# Patient Record
Sex: Female | Born: 1985 | Race: White | Hispanic: No | Marital: Married | Smoking: Never smoker
Health system: Southern US, Community
[De-identification: ages and names within clinical notes are randomized; demographics above are authoritative.]

## PROBLEM LIST (undated history)

## (undated) DIAGNOSIS — K8020 Calculus of gallbladder without cholecystitis without obstruction: Secondary | ICD-10-CM

## (undated) HISTORY — PX: CHOLECYSTECTOMY: SHX55

## (undated) HISTORY — PX: TONSILLECTOMY AND ADENOIDECTOMY: SUR1326

## (undated) HISTORY — DX: Calculus of gallbladder without cholecystitis without obstruction: K80.20

---

## 2013-04-05 ENCOUNTER — Emergency Department (HOSPITAL_COMMUNITY): Payer: Managed Care, Other (non HMO)

## 2013-04-05 ENCOUNTER — Emergency Department (HOSPITAL_COMMUNITY)
Admission: EM | Admit: 2013-04-05 | Discharge: 2013-04-05 | Disposition: A | Discharge status: Home / Self Care | Payer: Managed Care, Other (non HMO) | Attending: Emergency Medicine | Admitting: Emergency Medicine

## 2013-04-05 ENCOUNTER — Encounter (HOSPITAL_COMMUNITY): Payer: Self-pay | Admitting: Emergency Medicine

## 2013-04-05 DIAGNOSIS — R0602 Shortness of breath: Secondary | ICD-10-CM | POA: Insufficient documentation

## 2013-04-05 DIAGNOSIS — Z3202 Encounter for pregnancy test, result negative: Secondary | ICD-10-CM | POA: Insufficient documentation

## 2013-04-05 DIAGNOSIS — L259 Unspecified contact dermatitis, unspecified cause: Secondary | ICD-10-CM

## 2013-04-05 DIAGNOSIS — R110 Nausea: Secondary | ICD-10-CM | POA: Insufficient documentation

## 2013-04-05 DIAGNOSIS — K8020 Calculus of gallbladder without cholecystitis without obstruction: Secondary | ICD-10-CM | POA: Insufficient documentation

## 2013-04-05 DIAGNOSIS — Z79899 Other long term (current) drug therapy: Secondary | ICD-10-CM | POA: Insufficient documentation

## 2013-04-05 LAB — URINALYSIS, ROUTINE W REFLEX MICROSCOPIC
Bilirubin Urine: NEGATIVE
GLUCOSE, UA: NEGATIVE mg/dL
Hgb urine dipstick: NEGATIVE
KETONES UR: NEGATIVE mg/dL
LEUKOCYTES UA: NEGATIVE
NITRITE: NEGATIVE
PH: 6.5 (ref 5.0–8.0)
Protein, ur: NEGATIVE mg/dL
SPECIFIC GRAVITY, URINE: 1.006 (ref 1.005–1.030)
Urobilinogen, UA: 0.2 mg/dL (ref 0.0–1.0)

## 2013-04-05 LAB — COMPREHENSIVE METABOLIC PANEL
ALBUMIN: 3.4 g/dL — AB (ref 3.5–5.2)
ALT: 23 U/L (ref 0–35)
AST: 17 U/L (ref 0–37)
Alkaline Phosphatase: 63 U/L (ref 39–117)
BUN: 12 mg/dL (ref 6–23)
CO2: 24 mEq/L (ref 19–32)
Calcium: 8.8 mg/dL (ref 8.4–10.5)
Chloride: 104 mEq/L (ref 96–112)
Creatinine, Ser: 0.7 mg/dL (ref 0.50–1.10)
GFR calc Af Amer: >90 mL/min (ref >90)
GFR calc non Af Amer: >90 mL/min (ref >90)
Glucose, Bld: 90 mg/dL (ref 70–99)
POTASSIUM: 4.1 meq/L (ref 3.7–5.3)
Sodium: 139 mEq/L (ref 137–147)
TOTAL PROTEIN: 6.4 g/dL (ref 6.0–8.3)
Total Bilirubin: <0.2 mg/dL — ABNORMAL LOW (ref 0.3–1.2)

## 2013-04-05 LAB — CBC WITH DIFFERENTIAL/PLATELET
BASOS ABS: 0 10*3/uL (ref 0.0–0.1)
BASOS PCT: 0 % (ref 0–1)
EOS ABS: 0.1 10*3/uL (ref 0.0–0.7)
Eosinophils Relative: 1 % (ref 0–5)
HCT: 35.5 % — ABNORMAL LOW (ref 36.0–46.0)
Hemoglobin: 11.6 g/dL — ABNORMAL LOW (ref 12.0–15.0)
Lymphocytes Relative: 29 % (ref 12–46)
Lymphs Abs: 2.1 10*3/uL (ref 0.7–4.0)
MCH: 30.2 pg (ref 26.0–34.0)
MCHC: 32.7 g/dL (ref 30.0–36.0)
MCV: 92.4 fL (ref 78.0–100.0)
Monocytes Absolute: 0.3 10*3/uL (ref 0.1–1.0)
Monocytes Relative: 5 % (ref 3–12)
NEUTROS ABS: 4.8 10*3/uL (ref 1.7–7.7)
NEUTROS PCT: 65 % (ref 43–77)
PLATELETS: 363 10*3/uL (ref 150–400)
RBC: 3.84 MIL/uL — AB (ref 3.87–5.11)
RDW: 13.1 % (ref 11.5–15.5)
WBC: 7.3 10*3/uL (ref 4.0–10.5)

## 2013-04-05 LAB — POCT PREGNANCY, URINE: Preg Test, Ur: NEGATIVE

## 2013-04-05 LAB — LIPASE, BLOOD: Lipase: 37 U/L (ref 11–59)

## 2013-04-05 MED ORDER — METHYLPREDNISOLONE SODIUM SUCC 125 MG IJ SOLR
125.0000 mg | Freq: Once | INTRAMUSCULAR | Status: AC
  Administered 2013-04-05: 125 mg via INTRAVENOUS
  Filled 2013-04-05: qty 2

## 2013-04-05 MED ORDER — PREDNISONE 20 MG PO TABS: 60.0000 mg | ORAL_TABLET | Freq: Every day | ORAL | Status: DC

## 2013-04-05 MED ORDER — OXYCODONE-ACETAMINOPHEN 5-325 MG PO TABS: 1.0000 | ORAL_TABLET | ORAL | Status: DC | PRN

## 2013-04-05 MED ORDER — ONDANSETRON HCL 4 MG/2ML IJ SOLN
4.0000 mg | Freq: Once | INTRAMUSCULAR | Status: AC
  Administered 2013-04-05: 4 mg via INTRAVENOUS
  Filled 2013-04-05: qty 2

## 2013-04-05 MED ORDER — GI COCKTAIL ~~LOC~~
30.0000 mL | Freq: Once | ORAL | Status: AC
  Administered 2013-04-05: 30 mL via ORAL
  Filled 2013-04-05: qty 30

## 2013-04-05 MED ORDER — SODIUM CHLORIDE 0.9 % IV BOLUS (SEPSIS)
1000.0000 mL | Freq: Once | INTRAVENOUS | Status: AC
  Administered 2013-04-05: 1000 mL via INTRAVENOUS

## 2013-04-05 MED ORDER — PANTOPRAZOLE SODIUM 40 MG IV SOLR
40.0000 mg | Freq: Once | INTRAVENOUS | Status: AC
  Administered 2013-04-05: 40 mg via INTRAVENOUS
  Filled 2013-04-05: qty 40

## 2013-04-05 MED ORDER — DIPHENHYDRAMINE HCL 50 MG/ML IJ SOLN
50.0000 mg | Freq: Once | INTRAMUSCULAR | Status: AC
  Administered 2013-04-05: 50 mg via INTRAVENOUS
  Filled 2013-04-05: qty 1

## 2013-04-05 MED ORDER — MORPHINE SULFATE 4 MG/ML IJ SOLN
4.0000 mg | Freq: Once | INTRAMUSCULAR | Status: AC
  Administered 2013-04-05: 4 mg via INTRAVENOUS
  Filled 2013-04-05: qty 1

## 2013-04-05 MED ORDER — ONDANSETRON HCL 4 MG PO TABS: 4.0000 mg | ORAL_TABLET | Freq: Four times a day (QID) | ORAL | Status: DC

## 2013-04-05 NOTE — Discharge Instructions (Signed)
Cholelithiasis Cholelithiasis (also called gallstones) is a form of gallbladder disease in which gallstones form in your gallbladder. The gallbladder is an organ that stores bile made in the liver, which helps digest fats. Gallstones begin as small crystals and slowly grow into stones. Gallstone pain occurs when the gallbladder spasms and a gallstone is blocking the duct. Pain can also occur when a stone passes out of the duct.  RISK FACTORS  Being female.   Having multiple pregnancies. Health care providers sometimes advise removing diseased gallbladders before future pregnancies.   Being obese.  Eating a diet heavy in fried foods and fat.   Being older than 60 years and increasing age.   Prolonged use of medicines containing female hormones.   Having diabetes mellitus.   Rapidly losing weight.   Having a family history of gallstones (heredity).  SYMPTOMS  Nausea.   Vomiting.  Abdominal pain.   Yellowing of the skin (jaundice).   Sudden pain. It may persist from several minutes to several hours.  Fever.   Tenderness to the touch. In some cases, when gallstones do not move into the bile duct, people have no pain or symptoms. These are called "silent" gallstones.  TREATMENT Silent gallstones do not need treatment. In severe cases, emergency surgery may be required. Options for treatment include:  Surgery to remove the gallbladder. This is the most common treatment.  Medicines. These do not always work and may take 6 12 months or more to work.  Shock wave treatment (extracorporeal biliary lithotripsy). In this treatment an ultrasound machine sends shock waves to the gallbladder to break gallstones into smaller pieces that can pass into the intestines or be dissolved by medicine. HOME CARE INSTRUCTIONS   Only take over-the-counter or prescription medicines for pain, discomfort, or fever as directed by your health care provider.   Follow a low-fat diet until  seen again by your health care provider. Fat causes the gallbladder to contract, which can result in pain.   Follow up with your health care provider as directed. Attacks are almost always recurrent and surgery is usually required for permanent treatment.  SEEK IMMEDIATE MEDICAL CARE IF:   Your pain increases and is not controlled by medicines.   You have a fever or persistent symptoms for more than 2 3 days.   You have a fever and your symptoms suddenly get worse.   You have persistent nausea and vomiting.  MAKE SURE YOU:   Understand these instructions.  Will watch your condition.  Will get help right away if you are not doing well or get worse. Document Released: 03/16/2005 Document Revised: 11/20/2012 Document Reviewed: 09/11/2012 [facility] Patient Information 2014 [place], [place].  Contact Dermatitis Contact dermatitis is a reaction to certain substances that touch the skin. Contact dermatitis can be either irritant contact dermatitis or allergic contact dermatitis. Irritant contact dermatitis does not require previous exposure to the substance for a reaction to occur.Allergic contact dermatitis only occurs if you have been exposed to the substance before. Upon a repeat exposure, your body reacts to the substance.  CAUSES  Many substances can cause contact dermatitis. Irritant dermatitis is most commonly caused by repeated exposure to mildly irritating substances, such as:  Makeup.  Soaps.  Detergents.  Bleaches.  Acids.  Metal salts, such as nickel. Allergic contact dermatitis is most commonly caused by exposure to:  Poisonous plants.  Chemicals (deodorants, shampoos).  Jewelry.  Latex.  Neomycin in triple antibiotic cream.  Preservatives in products, including clothing. SYMPTOMS  The area of skin that is exposed may develop:  Dryness or flaking.  Redness.  Cracks.  Itching.  Pain or a burning sensation.  Blisters. With allergic contact  dermatitis, there may also be swelling in areas such as the eyelids, mouth, or genitals.  DIAGNOSIS  Your caregiver can usually tell what the problem is by doing a physical exam. In cases where the cause is uncertain and an allergic contact dermatitis is suspected, a patch skin test may be performed to help determine the cause of your dermatitis. TREATMENT Treatment includes protecting the skin from further contact with the irritating substance by avoiding that substance if possible. Barrier creams, powders, and gloves may be helpful. Your caregiver may also recommend:  Steroid creams or ointments applied 2 times daily. For best results, soak the rash area in cool water for 20 minutes. Then apply the medicine. Cover the area with a plastic wrap. You can store the steroid cream in the refrigerator for a "chilly" effect on your rash. That may decrease itching. Oral steroid medicines may be needed in more severe cases.  Antibiotics or antibacterial ointments if a skin infection is present.  Antihistamine lotion or an antihistamine taken by mouth to ease itching.  Lubricants to keep moisture in your skin.  Burow's solution to reduce redness and soreness or to dry a weeping rash. Mix one packet or tablet of solution in 2 cups cool water. Dip a clean washcloth in the mixture, wring it out a bit, and put it on the affected area. Leave the cloth in place for 30 minutes. Do this as often as possible throughout the day.  Taking several cornstarch or baking soda baths daily if the area is too large to cover with a washcloth. Harsh chemicals, such as alkalis or acids, can cause skin damage that is like a burn. You should flush your skin for 15 to 20 minutes with cold water after such an exposure. You should also seek immediate medical care after exposure. Bandages (dressings), antibiotics, and pain medicine may be needed for severely irritated skin.  HOME CARE INSTRUCTIONS  Avoid the substance that caused  your reaction.  Keep the area of skin that is affected away from hot water, soap, sunlight, chemicals, acidic substances, or anything else that would irritate your skin.  Do not scratch the rash. Scratching may cause the rash to become infected.  You may take cool baths to help stop the itching.  Only take over-the-counter or prescription medicines as directed by your caregiver.  See your caregiver for follow-up care as directed to make sure your skin is healing properly. SEEK MEDICAL CARE IF:   Your condition is not better after 3 days of treatment.  You seem to be getting worse.  You see signs of infection such as swelling, tenderness, redness, soreness, or warmth in the affected area.  You have any problems related to your medicines. Document Released: 03/17/2000 Document Revised: 06/12/2011 Document Reviewed: 08/23/2010 [facility] Patient Information 2014 [place], [place].   You may continue to take Benadryl 50 mg every 8 hours as needed for itching. This medication is found over-the-counter.

## 2013-04-05 NOTE — ED Notes (Signed)
Pt having ultrasound in room. Ultrasound tech states that she needs pts bladder full for ultrasound

## 2013-04-05 NOTE — ED Notes (Addendum)
Patient has red rash that started on both knees but then spread throughout her body. Patient mostly concerned with the pain in her chest when she swallows that radiates to her back. 2/10pain at this moment

## 2013-04-05 NOTE — ED Provider Notes (Signed)
TIME SEEN: 6:22 PM  CHIEF COMPLAINT: Chest pain, abdominal pain  HPI: Patient is a 28 year old female with no significant past medical history who presents emergency department with 3 years of intermittent chest pain and epigastric, right upper quadrant pain. She reports that she has had an ultrasound that showed cholelithiasis in the past. She states that over the past 4 days her pain has been constant. She describes it as a sharp pain that radiates into her back. She states she does have some mild shortness of breath with it and nausea. No vomiting or diarrhea. No bloody stools or melena. She states she drinks occasionally and takes 800 mg of ibuprofen once daily for this pain. She denies any fevers but states she has had chills. She denies a prior history of PE or DVT, lower extreme swelling or pain, tobacco use, exogenous estrogen use, recent prolonged immobilization such as flight or hospitalization, surgery, trauma. She states she has Mirena IUD.  Patient is worse after eating spicy and fatty foods. Patient reports her pain improves with ibuprofen.  ROS: See HPI Constitutional: no fever  Eyes: no drainage  ENT: no runny nose   Cardiovascular:   chest pain  Resp: SOB  GI: no vomiting GU: no dysuria Integumentary: no rash  Allergy: no hives  Musculoskeletal: no leg swelling  Neurological: no slurred speech ROS otherwise negative  PAST MEDICAL HISTORY/PAST SURGICAL HISTORY:  History reviewed. No pertinent past medical history.  MEDICATIONS:  Prior to Admission medications   Medication Sig Start Date End Date Taking? Authorizing Provider  Melatonin 3 MG TABS Take 6 mg by mouth at bedtime.   Yes Historical Provider, MD  sertraline (ZOLOFT) 50 MG tablet Take 50 mg by mouth daily.  03/25/13  Yes Historical Provider, MD    ALLERGIES:  No Known Allergies  SOCIAL HISTORY:  History  Substance Use Topics  . Smoking status: Never Smoker   . Smokeless tobacco: Not on file  . Alcohol  Use: Not on file    FAMILY HISTORY: History reviewed. No pertinent family history.  No history of premature CAD.  EXAM: BP 127/76  Pulse 75  Temp(Src) 98.3 F (36.8 C) (Oral)  Resp 20  SpO2 99% CONSTITUTIONAL: Alert and oriented and responds appropriately to questions. Well-appearing; well-nourished HEAD: Normocephalic EYES: Conjunctivae clear, PERRL ENT: normal nose; no rhinorrhea; moist mucous membranes; pharynx without lesions noted NECK: Supple, no meningismus, no LAD  CARD: RRR; S1 and S2 appreciated; no murmurs, no clicks, no rubs, no gallops RESP: Normal chest excursion without splinting or tachypnea; breath sounds clear and equal bilaterally; no wheezes, no rhonchi, no rales,  ABD/GI: Normal bowel sounds; non-distended; soft, tender to palpation in the right upper quadrant without guarding or rebound, no peritoneal signs, negative Murphy sign BACK:  The back appears normal and is non-tender to palpation, there is no CVA tenderness EXT: Normal ROM in all joints; non-tender to palpation; no edema; normal capillary refill; no cyanosis    SKIN: Normal color for age and race; warm; multiple erythematous patches to patient's extremities and neck she reports is pruritic, flat NEURO: Moves all extremities equally PSYCH: The patient's mood and manner are appropriate. Grooming and personal hygiene are appropriate.  MEDICAL DECISION MAKING: Patient here with chest pain and epigastric pain for the past 4 days has been constant. Likely secondary to her biliary colic  Will obtain labs, urine, repeat ultrasound to rule out choledocholithiasis and cholecystitis given constant pain. We'll give antiemetics and pain medication. Patient has no  risk factors for pulmonary embolus or ACS.  ED PROGRESS: Patient's ultrasound shows cholelithiasis without any signs of cholecystitis. Her labs are unremarkable. She reports some improvement after morphine. Will give GI cocktail, PPI and repeat morphine and  reassess.   9:47 PM Pt reports feeling better. She has been able to tolerate by mouth in the ED. we'll discharge home with outpatient surgery followup, pain and nausea medication. Given return precautions. Patient and husband at bedside verbalize understanding and are comfortable with plan. We'll also discharge home with prednisone for her contact dermatitis. Her rash is improved while in the ED. We'll have her continue taking Benadryl when necessary.  Layla Maw Cindy Brindisi, DO 04/05/13 [id number]

## 2013-04-05 NOTE — ED Notes (Signed)
Pt was able to drink some water prior to being given her GI cocktail

## 2013-04-05 NOTE — ED Notes (Signed)
She c/o epigastric pain radiating into upper back/x 4 days.  She states she was found to have cholelithiasis 3 years ago--seen on OB ultrasound.  She is in no distress.  She also mentions she has an itchy rash at upper extremities and upper trunk area since yesterday.  She is in no distress.  She c/o anorexia today.

## 2013-04-05 NOTE — ED Notes (Signed)
Pt made aware of need for urine specimen 

## 2013-04-07 ENCOUNTER — Encounter: Payer: Self-pay | Admitting: Nurse Practitioner

## 2013-04-07 ENCOUNTER — Ambulatory Visit (INDEPENDENT_AMBULATORY_CARE_PROVIDER_SITE_OTHER): Payer: Managed Care, Other (non HMO) | Admitting: Nurse Practitioner

## 2013-04-07 VITALS — BP 102/80 | HR 64 | Temp 98.0°F | Ht 67.0 in | Wt 275.2 lb

## 2013-04-07 DIAGNOSIS — K8020 Calculus of gallbladder without cholecystitis without obstruction: Secondary | ICD-10-CM

## 2013-04-07 DIAGNOSIS — L282 Other prurigo: Secondary | ICD-10-CM

## 2013-04-07 NOTE — Patient Instructions (Signed)
Please consult with surgeon regarding removal of gallbladder. In the meantime, avoid animal fats, only consume plant fats like avocado, nuts, seeds, olives, stay hydrated by sipping colorless soda or water every hour while awake.  Schedule appt for CPE in future at your convenience. Pleasure to meet you!   Cholelithiasis Cholelithiasis (also called gallstones) is a form of gallbladder disease in which gallstones form in your gallbladder. The gallbladder is an organ that stores bile made in the liver, which helps digest fats. Gallstones begin as small crystals and slowly grow into stones. Gallstone pain occurs when the gallbladder spasms and a gallstone is blocking the duct. Pain can also occur when a stone passes out of the duct.  RISK FACTORS  Being female.   Having multiple pregnancies. Health care providers sometimes advise removing diseased gallbladders before future pregnancies.   Being obese.  Eating a diet heavy in fried foods and fat.   Being older than 60 years and increasing age.   Prolonged use of medicines containing female hormones.   Having diabetes mellitus.   Rapidly losing weight.   Having a family history of gallstones (heredity).  SYMPTOMS  Nausea.   Vomiting.  Abdominal pain.   Yellowing of the skin (jaundice).   Sudden pain. It may persist from several minutes to several hours.  Fever.   Tenderness to the touch. In some cases, when gallstones do not move into the bile duct, people have no pain or symptoms. These are called "silent" gallstones.  TREATMENT Silent gallstones do not need treatment. In severe cases, emergency surgery may be required. Options for treatment include:  Surgery to remove the gallbladder. This is the most common treatment.  Medicines. These do not always work and may take 6 12 months or more to work.  Shock wave treatment (extracorporeal biliary lithotripsy). In this treatment an ultrasound machine sends shock  waves to the gallbladder to break gallstones into smaller pieces that can pass into the intestines or be dissolved by medicine. HOME CARE INSTRUCTIONS   Only take over-the-counter or prescription medicines for pain, discomfort, or fever as directed by your health care provider.   Follow a low-fat diet until seen again by your health care provider. Fat causes the gallbladder to contract, which can result in pain.   Follow up with your health care provider as directed. Attacks are almost always recurrent and surgery is usually required for permanent treatment.  SEEK IMMEDIATE MEDICAL CARE IF:   Your pain increases and is not controlled by medicines.   You have a fever or persistent symptoms for more than 2 3 days.   You have a fever and your symptoms suddenly get worse.   You have persistent nausea and vomiting.  MAKE SURE YOU:   Understand these instructions.  Will watch your condition.  Will get help right away if you are not doing well or get worse. Document Released: 03/16/2005 Document Revised: 11/20/2012 Document Reviewed: 09/11/2012 [facility] Patient Information 2014 [place], [place].

## 2013-04-07 NOTE — Progress Notes (Signed)
Subjective:     Anita Gray is a 28 y.o. female who presents for follow up after an ED visit for abdominal pain. She wishes to establish care. She gives history of "gallbladder attacks" for 3 years with worse recent attack lasting 4 days and accompanied by nausea, anorexia, epigastric pain heart burn, and total body rash. Abd US in ED confirmed GB stones without inflammation of GB, o/w nml. All labs nml-liver, amylase, lytes, CBC. She was given solu-medrol in ED & script for prednisone which has made rash some better, but it is persistent. She is taking benadryl every 4 hours.    The patient's history has been marked as reviewed and updated as appropriate.  Review of Systems Constitutional: negative for weight loss Respiratory: negative for cough Cardiovascular: negative for chest pain and irregular heart beat Gastrointestinal: positive for abdominal pain, nausea and reflux symptoms, negative for change in bowel habits, diarrhea, jaundice, melena and vomiting Genitourinary:negative Integument/breast: positive for pruritus and rash Musculoskeletal:negative for arthralgias and stiff joints Allergic/Immunologic: negative     Objective:    BP 102/80  Pulse 64  Temp(Src) 98 F (36.7 C) (Oral)  Ht 5\' 7"  (1.702 m)  Wt 275 lb 4 oz (124.853 kg)  BMI 43.10 kg/m2  SpO2 97% General appearance: alert, cooperative, appears stated age and no distress Head: Normocephalic, without obvious abnormality, atraumatic Eyes: negative findings: lids and lashes normal and conjunctivae and sclerae normal Throat: lips, mucosa, and tongue normal; teeth and gums normal Neck: no adenopathy, no carotid bruit, supple, symmetrical, trachea midline and thyroid not enlarged, symmetric, no tenderness/mass/nodules Lungs: clear to auscultation bilaterally Heart: regular rate and rhythm, S1, S2 normal, no murmur, click, rub or gallop Abdomen: soft, non-tender; bowel sounds normal; no masses,  no  organomegaly Extremities: edema mild pitting at ankles  Pulses: 2+ and symmetric Skin: splotchy redn patches face, lacy macular red rash back & abdomen, arms, Also upper arms solid patch erythema as if sun burned.    Assessment:    Abdominal pain, likely secondary to gall stones Rash possibly r/t cholestasis, improved with systemic steroids, but persistent & pruritic .    Plan:    Referral to Gen surgery for consult Cont oral steroids. Stop benadryl. Start claritin or zyrtec.

## 2013-04-07 NOTE — Progress Notes (Signed)
Pre-visit discussion using our clinic review tool. No additional management support is needed unless otherwise documented below in the visit note.  

## 2013-04-14 ENCOUNTER — Telehealth (INDEPENDENT_AMBULATORY_CARE_PROVIDER_SITE_OTHER): Payer: Self-pay | Admitting: General Surgery

## 2013-04-14 ENCOUNTER — Ambulatory Visit (INDEPENDENT_AMBULATORY_CARE_PROVIDER_SITE_OTHER): Payer: Managed Care, Other (non HMO) | Admitting: General Surgery

## 2013-04-14 ENCOUNTER — Encounter (INDEPENDENT_AMBULATORY_CARE_PROVIDER_SITE_OTHER): Payer: Self-pay | Admitting: General Surgery

## 2013-04-14 VITALS — BP 122/80 | HR 83 | Temp 98.5°F | Resp 16 | Ht 69.0 in | Wt 273.8 lb

## 2013-04-14 DIAGNOSIS — K8020 Calculus of gallbladder without cholecystitis without obstruction: Secondary | ICD-10-CM

## 2013-04-14 NOTE — Progress Notes (Signed)
Patient ID: Anita Gray, female   DOB: 02-May-1985, 28 y.o.   MRN: [id number]  Chief Complaint  Patient presents with  . Cholelithiasis    new patient     HPI Anita ShellGeorgianna Gray is a 28 y.o. female.   HPI 28 yo WF referred by Maximino SarinLayne Weaver, NP, for evaluation of gallstones. The patient states that she learned that she had gallstones during her 2011 pregnancy. She had several attacks of discomfort in her epigastrium and right upper quadrant which prompted an ultrasound demonstrating gallstones. She states that generally she would have 2-3 episodes a month. However 2 weeks ago she developed recurring epigastric and right upper quadrant pain everyday for 4 days in a row. She took ibuprofen which did help a little bit. The other Friday night and Saturday she developed a severe attack that was the worst pain she had had today prompting her to go to the emergency room. She also did develop a rash that started on her knees and spread over her entire body. She denies any new medications or soaps or detergents. She was given steroids for the rash and it has subsequently resolved. She really hasn't had any severe attacks since that episode the other Saturday. She denies any acholic stools. She denies any jaundice. She denies any weight loss. She denies any melena or hematochezia. She works as an account Past Medical History  Diagnosis Date  . Cholelithiasis     Past Surgical History  Procedure Laterality Date  . Cesarean section    . Tonsillectomy and adenoidectomy      Family History  Problem Relation Age of Onset  . Hypertension Mother   . Depression Mother   . Cholecystitis Mother   . Diabetes Father   . Cholecystitis Sister   . Cancer Maternal Grandmother     breast  . Cancer Maternal Grandfather     metastatic    Social History History  Substance Use Topics  . Smoking status: Never Smoker   . Smokeless tobacco: Not on file  . Alcohol Use: No    No Known  Allergies  Current Outpatient Prescriptions  Medication Sig Dispense Refill  . Melatonin 3 MG TABS Take 6 mg by mouth at bedtime.      . sertraline (ZOLOFT) 50 MG tablet Take 50 mg by mouth daily.        No current facility-administered medications for this visit.    Review of Systems Review of Systems  Constitutional: Negative for fever, activity change, appetite change and unexpected weight change.  HENT: Negative for nosebleeds and trouble swallowing.   Eyes: Negative for photophobia and visual disturbance.  Respiratory: Negative for chest tightness and shortness of breath.   Cardiovascular: Negative for chest pain and leg swelling.       Denies CP, SOB, orthopnea, PND, DOE  Gastrointestinal:       See HPI  Genitourinary: Negative for dysuria and difficulty urinating.  Musculoskeletal: Negative for arthralgias.  Skin: Positive for rash (recent rash during most recent attack - but now resolved. ). Negative for pallor.  Neurological: Negative for dizziness, seizures, facial asymmetry and numbness.       Denies TIA and amaurosis fugax   Hematological: Negative for adenopathy. Does not bruise/bleed easily.  Psychiatric/Behavioral: Negative for behavioral problems and agitation.    Blood pressure 122/80, pulse 83, temperature 98.5 F (36.9 C), temperature source Temporal, resp. rate 16, height 5\' 9"  (1.753 m), weight 273 lb 12.8 oz (124.195 kg).  Physical Exam Physical Exam  Vitals reviewed. Constitutional: She is oriented to person, place, and time. She appears well-developed and well-nourished. No distress.  obese  HENT:  Head: Normocephalic and atraumatic.  Right Ear: External ear normal.  Left Ear: External ear normal.  Eyes: Conjunctivae are normal. No scleral icterus.  Neck: Normal range of motion. Neck supple. No tracheal deviation present. No thyromegaly present.  Cardiovascular: Normal rate and normal heart sounds.   Pulmonary/Chest: Effort normal and breath sounds  normal. No stridor. No respiratory distress. She has no wheezes.  Abdominal: Soft. She exhibits no distension. There is no tenderness. There is no rebound and no guarding.  Musculoskeletal: She exhibits no edema and no tenderness.  Lymphadenopathy:    She has no cervical adenopathy.  Neurological: She is alert and oriented to person, place, and time. She exhibits normal muscle tone.  Skin: Skin is warm and dry. No rash noted. She is not diaphoretic. No erythema.  Psychiatric: She has a normal mood and affect. Her behavior is normal. Judgment and thought content normal.    Data Reviewed Alben Spittle, NP note 04/07/13 ED note 04/05/13 abd u/s - multiple GS, duct ok Labs from 04/05/13 ok  Assessment    Symptomatic cholelithiasis     Plan    I believe the patient's symptoms are consistent with gallbladder disease.  We discussed gallbladder disease. The patient was given Agricultural engineer. We discussed non-operative and operative management. We discussed the signs & symptoms of acute cholecystitis  I discussed laparoscopic cholecystectomy with IOC in detail.  The patient was given educational material as well as diagrams detailing the procedure.  We discussed the risks and benefits of a laparoscopic cholecystectomy including, but not limited to bleeding, infection, injury to surrounding structures such as the intestine or liver, bile leak, retained gallstones, need to convert to an open procedure, prolonged diarrhea, blood clots such as  DVT, common bile duct injury, anesthesia risks, and possible need for additional procedures.  We discussed the typical post-operative recovery course. I explained that the likelihood of improvement of their symptoms is good.  She was given access to the [facility] cholecystectomy video  She has elected to proceed with surgery. She will be scheduled in the near future. Not sure of the etiology of her rash. Most likely it was a drug related rash. I have not heard of a  macular papular rash in the setting of gallbladder disease  Mary Sella. Andrey Campanile, MD, FACS General, Bariatric, & Minimally Invasive Surgery [facility], [place]         [facility] M 04/14/2013, 3:21 PM

## 2013-04-14 NOTE — Telephone Encounter (Signed)
Pt made aware of [facility] financial obligation will call back to schedule surgery

## 2013-04-14 NOTE — Patient Instructions (Signed)
Watch EMMI video  Please call with any questions

## 2013-04-22 ENCOUNTER — Other Ambulatory Visit (INDEPENDENT_AMBULATORY_CARE_PROVIDER_SITE_OTHER): Payer: Self-pay | Admitting: General Surgery

## 2013-04-22 DIAGNOSIS — K8010 Calculus of gallbladder with chronic cholecystitis without obstruction: Secondary | ICD-10-CM

## 2013-04-23 ENCOUNTER — Other Ambulatory Visit (INDEPENDENT_AMBULATORY_CARE_PROVIDER_SITE_OTHER): Payer: Self-pay | Admitting: *Deleted

## 2013-04-23 MED ORDER — OXYCODONE-ACETAMINOPHEN 5-325 MG PO TABS: 1.0000 | ORAL_TABLET | ORAL | Status: DC | PRN

## 2013-05-14 ENCOUNTER — Ambulatory Visit (INDEPENDENT_AMBULATORY_CARE_PROVIDER_SITE_OTHER): Payer: Managed Care, Other (non HMO) | Admitting: General Surgery

## 2013-05-14 ENCOUNTER — Encounter (INDEPENDENT_AMBULATORY_CARE_PROVIDER_SITE_OTHER): Payer: Self-pay | Admitting: General Surgery

## 2013-05-14 VITALS — BP 128/76 | HR 62 | Temp 98.0°F | Resp 18 | Ht 68.0 in | Wt 269.0 lb

## 2013-05-14 DIAGNOSIS — Z09 Encounter for follow-up examination after completed treatment for conditions other than malignant neoplasm: Secondary | ICD-10-CM

## 2013-05-14 NOTE — Patient Instructions (Signed)
Can resume full activities 

## 2013-05-14 NOTE — Progress Notes (Signed)
Subjective:     Patient ID: Anita Gray, female   DOB: Sep 14, 1985, 28 y.o.   MRN: [id number]  HPI 28 year old Caucasian female status post laparoscopic cholecystectomy with interoperative claims gram on January 20 for symptomatic right upper quadrant pain comes in today for her followup visit. She states that she is doing well. She has none of the symptoms that she had preoperatively. She denies any fever, chills, nausea, vomiting, diarrhea or constipation. She denies any trouble with her incisions.  Review of Systems     Objective:   Physical Exam BP 128/76  Pulse 62  Temp(Src) 98 F (36.7 C)  Resp 18  Ht 5\' 8"  (1.727 m)  Wt 269 lb (122.018 kg)  BMI 40.91 kg/m2  Gen: alert, NAD, non-toxic appearing Pupils: equal, no scleral icterus Pulm: Lungs clear to auscultation, symmetric chest rise CV: regular rate and rhythm Abd: soft, nontender, nondistended. Well-healed trocar sites. No cellulitis. No incisional hernia Skin: no rash, no jaundice     Assessment:     Status post laparoscopic cholecystectomy with interoperative cholangiogram     Plan:     She is doing well. There appears to be no signs of complications from surgery. We reviewed her pathology report which showed gallstones and chronic cholecystitis. All of her questions were answered. Followup as needed   Mary Sellaric M. Andrey CampanileWilson, MD, FACS General, Bariatric, & Minimally Invasive Surgery [facility], [place]

## 2013-09-17 ENCOUNTER — Ambulatory Visit (INDEPENDENT_AMBULATORY_CARE_PROVIDER_SITE_OTHER): Payer: Managed Care, Other (non HMO) | Admitting: Nurse Practitioner

## 2013-09-17 ENCOUNTER — Encounter: Payer: Self-pay | Admitting: Nurse Practitioner

## 2013-09-17 VITALS — BP 110/74 | HR 95 | Temp 98.0°F | Ht 67.0 in | Wt 275.0 lb

## 2013-09-17 DIAGNOSIS — H6990 Unspecified Eustachian tube disorder, unspecified ear: Secondary | ICD-10-CM

## 2013-09-17 DIAGNOSIS — H6980 Other specified disorders of Eustachian tube, unspecified ear: Secondary | ICD-10-CM

## 2013-09-17 DIAGNOSIS — E0789 Other specified disorders of thyroid: Secondary | ICD-10-CM

## 2013-09-17 DIAGNOSIS — J029 Acute pharyngitis, unspecified: Secondary | ICD-10-CM

## 2013-09-17 LAB — TSH: TSH: 1.63 u[IU]/mL (ref 0.35–4.50)

## 2013-09-17 LAB — T4, FREE: Free T4: 0.72 ng/dL (ref 0.60–1.60)

## 2013-09-17 MED ORDER — FLUTICASONE PROPIONATE 50 MCG/ACT NA SUSP: 1.0000 | Freq: Two times a day (BID) | NASAL | Status: DC

## 2013-09-17 NOTE — Progress Notes (Signed)
Pre visit review using our clinic review tool, if applicable. No additional management support is needed unless otherwise documented below in the visit note. 

## 2013-09-17 NOTE — Patient Instructions (Addendum)
You may use benzocaine throat lozenges or throat spray for comfort.This is likely a viral sore throat/upper respiratory infection. However, if your culture comes back growing bacteria, I will call in an antibiotic. In the meantime, start Neilmed sinus rinses daily. Salt water & listerene gargles several times daily. You may use benzocaine throat lozenges or throat spray for comfort. Rest, sip fluids every hour.  Get thyroid ultrasound. My office will call with results. Use flonase daily for at least 1 month. It may help with stuffy ears. Feel better!  Sore Throat A sore throat is pain, burning, irritation, or scratchiness of the throat. There is often pain or tenderness when swallowing or talking. A sore throat may be accompanied by other symptoms, such as coughing, sneezing, fever, and swollen neck glands. A sore throat is often the first sign of another sickness, such as a cold, flu, strep throat, or mononucleosis (commonly known as mono). Most sore throats go away without medical treatment. CAUSES  The most common causes of a sore throat include:  A viral infection, such as a cold, flu, or mono.  A bacterial infection, such as strep throat, tonsillitis, or whooping cough.  Seasonal allergies.  Dryness in the air.  Irritants, such as smoke or pollution.  Gastroesophageal reflux disease (GERD). HOME CARE INSTRUCTIONS   Only take over-the-counter medicines as directed by your caregiver.  Drink enough fluids to keep your urine clear or pale yellow.  Rest as needed.  Try using throat sprays, lozenges, or sucking on hard candy to ease any pain (if older than 4 years or as directed).  Sip warm liquids, such as broth, herbal tea, or warm water with honey to relieve pain temporarily. You may also eat or drink cold or frozen liquids such as frozen ice pops.  Gargle with salt water (mix 1 tsp salt with 8 oz of water).  Do not smoke and avoid secondhand smoke.  Put a cool-mist humidifier  in your bedroom at night to moisten the air. You can also turn on a hot shower and sit in the bathroom with the door closed for 5 10 minutes. SEEK IMMEDIATE MEDICAL CARE IF:  You have difficulty breathing.  You are unable to swallow fluids, soft foods, or your saliva.  You have increased swelling in the throat.  Your sore throat does not get better in 7 days.  You have nausea and vomiting.  You have a fever or persistent symptoms for more than 2 3 days.  You have a fever and your symptoms suddenly get worse. MAKE SURE YOU:   Understand these instructions.  Will watch your condition.  Will get help right away if you are not doing well or get worse. Document Released: 04/27/2004 Document Revised: 03/06/2012 Document Reviewed: 11/26/2011 [facility] Patient Information 2014 [place], [place].

## 2013-09-18 ENCOUNTER — Telehealth: Payer: Self-pay | Admitting: Nurse Practitioner

## 2013-09-18 ENCOUNTER — Ambulatory Visit
Admission: RE | Admit: 2013-09-18 | Discharge: 2013-09-18 | Disposition: A | Discharge status: Home / Self Care | Payer: Managed Care, Other (non HMO) | Source: Ambulatory Visit | Attending: Nurse Practitioner | Admitting: Nurse Practitioner

## 2013-09-18 DIAGNOSIS — E0789 Other specified disorders of thyroid: Secondary | ICD-10-CM

## 2013-09-18 LAB — THYROID PEROXIDASE ANTIBODY: Thyroperoxidase Ab SerPl-aCnc: <10 IU/mL (ref <35.0)

## 2013-09-18 NOTE — Telephone Encounter (Signed)
pls call pt: Advise nml thyroid US-no nodules.

## 2013-09-18 NOTE — Progress Notes (Signed)
   Subjective:    Patient ID: Anita Gray, female    DOB: 1985/12/19, 28 y.o.   MRN: [id number]  Sore Throat  This is a new problem. The current episode started 1 to 4 weeks ago (sore throat 1 week, ears stuffy 3 weeks). The problem has been unchanged. Neither side of throat is experiencing more pain than the other. There has been no fever. The pain is moderate. Associated symptoms include congestion, coughing, ear pain and a plugged ear sensation. Pertinent negatives include no diarrhea, ear discharge, headaches, shortness of breath, swollen glands or vomiting. She has tried nothing for the symptoms.      Review of Systems  Constitutional: Positive for chills and fatigue. Negative for fever.  HENT: Positive for congestion, ear pain and sore throat. Negative for ear discharge and voice change.   Respiratory: Positive for cough. Negative for chest tightness, shortness of breath and wheezing.   Gastrointestinal: Negative for nausea, vomiting and diarrhea.  Musculoskeletal: Negative for back pain.  Neurological: Negative for headaches.       Objective:   Physical Exam  Vitals reviewed. Constitutional: She is oriented to person, place, and time. She appears well-developed and well-nourished. No distress.  HENT:  Head: Normocephalic and atraumatic.  Right Ear: External ear and ear canal normal. Tympanic membrane is retracted. Tympanic membrane is not injected and not erythematous. No middle ear effusion.  Left Ear: External ear and ear canal normal. Tympanic membrane is retracted. Tympanic membrane is not injected and not erythematous.  No middle ear effusion.  Mouth/Throat: No oropharyngeal exudate.  Erythematous posterior pharynx, mild. Bilat TM scars  Eyes: Conjunctivae are normal. Right eye exhibits no discharge. Left eye exhibits no discharge.  Neck: Normal range of motion. Neck supple. Thyromegaly present.  Cardiovascular: Normal rate, regular rhythm and normal heart sounds.     No murmur heard. Pulmonary/Chest: Effort normal and breath sounds normal. No respiratory distress. She has no wheezes. She has no rales.  Lymphadenopathy:    She has no cervical adenopathy.  Neurological: She is alert and oriented to person, place, and time.  Skin: Skin is warm and dry.  Psychiatric: She has a normal mood and affect. Her behavior is normal. Thought content normal.          Assessment & Plan:  1. Sore throat - POCT rapid strep A-neg - Upper Respiratory Culture  2. Eustachian tube dysfunction Scars, retraction - fluticasone (FLONASE) 50 MCG/ACT nasal spray; Place 1 spray into both nostrils 2 (two) times daily.  Dispense: 16 g; Refill: 1  3. Thyroid fullness - T4, free - Thyroid peroxidase antibody - TSH - US Soft Tissue Head/Neck; Future

## 2013-09-18 NOTE — Telephone Encounter (Signed)
Left detailed message on pt's vm. Per Dpr

## 2013-09-18 NOTE — Telephone Encounter (Signed)
pls call pt: Advise Thyroid function tests all normal. Throat culture negative. Sore throat is viral. Comfort measures as discussed.

## 2013-09-19 LAB — CULTURE, UPPER RESPIRATORY: Organism ID, Bacteria: NORMAL

## 2013-09-19 NOTE — Telephone Encounter (Signed)
Patient notified of results.

## 2013-11-12 ENCOUNTER — Encounter: Payer: Self-pay | Admitting: Nurse Practitioner

## 2013-11-12 ENCOUNTER — Ambulatory Visit (INDEPENDENT_AMBULATORY_CARE_PROVIDER_SITE_OTHER): Payer: Managed Care, Other (non HMO) | Admitting: Nurse Practitioner

## 2013-11-12 VITALS — BP 100/60 | HR 67 | Temp 98.1°F | Ht 67.0 in | Wt 286.0 lb

## 2013-11-12 DIAGNOSIS — Z0000 Encounter for general adult medical examination without abnormal findings: Secondary | ICD-10-CM

## 2013-11-12 DIAGNOSIS — M6740 Ganglion, unspecified site: Secondary | ICD-10-CM

## 2013-11-12 DIAGNOSIS — F3289 Other specified depressive episodes: Secondary | ICD-10-CM

## 2013-11-12 DIAGNOSIS — F329 Major depressive disorder, single episode, unspecified: Secondary | ICD-10-CM

## 2013-11-12 DIAGNOSIS — Z6841 Body Mass Index (BMI) 40.0 and over, adult: Secondary | ICD-10-CM | POA: Insufficient documentation

## 2013-11-12 DIAGNOSIS — M67439 Ganglion, unspecified wrist: Secondary | ICD-10-CM | POA: Insufficient documentation

## 2013-11-12 DIAGNOSIS — R4589 Other symptoms and signs involving emotional state: Secondary | ICD-10-CM

## 2013-11-12 DIAGNOSIS — F32A Depression, unspecified: Secondary | ICD-10-CM | POA: Insufficient documentation

## 2013-11-12 DIAGNOSIS — M67431 Ganglion, right wrist: Secondary | ICD-10-CM

## 2013-11-12 MED ORDER — SERTRALINE HCL 50 MG PO TABS: 50.0000 mg | ORAL_TABLET | Freq: Every day | ORAL | Status: AC

## 2013-11-12 NOTE — Assessment & Plan Note (Signed)
Vaccines UTD Pap last year: had abnml w/colposcopy, then 3 nmls. Discussed risk for obesity related diseases.

## 2013-11-12 NOTE — Assessment & Plan Note (Signed)
1 cm, tender, painful ROM aspercream tid, ibuprophen bid, wrist splint Does not want ortho referral.

## 2013-11-12 NOTE — Patient Instructions (Signed)
Please see therapist for boundary setting.  Start ibuprophen 400 mg twice daily with food for several days, then as needed. Wear wrist splint at all times. Apply aspercream three times daily. If you decide you want to see orthopedist, please call my office.  Next year, I will refer to gynecology.  Return for fasting labs.  Nice to see you!

## 2013-11-12 NOTE — Progress Notes (Signed)
Subjective:     Anita Gray is a 28 y.o. female and is here for a comprehensive physical exam. The patient reports problems - depression, wrist pain. [name] has been treated for depression for several years. She takes zoloft w/good results-says it keeps her from wanting to cry-although she is crying today. She feels stress worse in last 4 mos due to job-working 60 hrs/week. She also identifies family stressors. She has seen therapist in past, her & husband have had marriage counseling without great strides.  Wrist has knot on it, painful to move, noticed several weeks ago. Ibuprophen helps. Historical providers[place]. [facility] [place]  History   Social History  . Marital Status: Married    Spouse Name: N/A    Number of Children: 2  . Years of Education: N/A   Occupational History  . accountant    Social History Main Topics  . Smoking status: Never Smoker   . Smokeless tobacco: Not on file  . Alcohol Use: 1.8 oz/week    3 Glasses of wine per week  . Drug Use: No  . Sexual Activity: Yes    Birth Control/ Protection: IUD   Other Topics Concern  . Not on file   Social History Narrative   [name] lives with her husband, two children, mother, & nephew in [place], [place]. 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   Health Maintenance  Topic Date Due  . Influenza Vaccine  11/01/2013  . Pap Smear  04/08/2015  . Tetanus/tdap  01/06/2023    The following portions of the patient's history were reviewed and updated as appropriate: allergies, current medications, past family history, past medical history, past social history, past surgical history and problem list.  Review of Systems Pertinent items are noted in HPI.   Objective:    BP 100/60  Pulse 67  Temp(Src) 98.1 F (36.7 C) (Oral)  Ht 5\' 7"  (1.702 m)  Wt 286 lb (129.729 kg)  BMI 44.78 kg/m2  SpO2 97% General appearance: alert, cooperative, appears  stated age, mild distress and tearful through most all of OV Head: Normocephalic, without obvious abnormality, atraumatic Eyes: negative findings: lids and lashes normal, conjunctivae and sclerae normal, corneas clear and pupils equal, round, reactive to light and accomodation Ears: scar L TM, no retraction. question cholesteatoma at top of TM. Throat: lips, mucosa, and tongue normal; teeth and gums normal Neck: no adenopathy, no carotid bruit, supple, symmetrical, trachea midline and thyroid not enlarged, symmetric, no tenderness/mass/nodules Lungs: clear to auscultation bilaterally Heart: regular rate and rhythm, S1, S2 normal, no murmur, click, rub or gallop Abdomen: soft, non-tender; bowel sounds normal; no masses,  no organomegaly Extremities: extremities normal, atraumatic, no cyanosis or edema Pulses: 2+ and symmetric Lymph nodes: Cervical, supraclavicular, and axillary nodes normal.    Assessment:   1. Depressed - sertraline (ZOLOFT) 50 MG tablet; Take 1 tablet (50 mg total) by mouth daily.  Dispense: 90 tablet; Refill: 1  2. Impaired personal boundaries - Ambulatory referral to Psychology  3. Preventative health care - Vit D  25 hydroxy (rtn osteoporosis monitoring)   4. BMI 40.0-44.9, adult - CBC with Differential - Hemoglobin A1c - Comprehensive metabolic panel - Lipid panel  5. Ganglion cyst of wrist, right  See problem list for complete A&P See pt instructions. F/u this week for fasting labs, then 1 mo for weight loss.

## 2013-11-12 NOTE — Assessment & Plan Note (Signed)
Contributing to overall depression, marital stress, job stress. CBT for boundary setting, follow through with consequences. Ref to [facility].

## 2013-11-12 NOTE — Progress Notes (Signed)
Pre visit review using our clinic review tool, if applicable. No additional management support is needed unless otherwise documented below in the visit note. 

## 2013-11-12 NOTE — Assessment & Plan Note (Signed)
Cut out refined sugars & flours. Carbs should have 4 gm per serving Keep walking. Goal is 30 minutes daily.

## 2013-11-12 NOTE — Assessment & Plan Note (Signed)
zoloft for several years Identifies work & family stressors. Tearful in ofc. Fam Hx depression, sister is addict. Continue zoloft CBT for self-awareness & boundary setting.

## 2013-11-14 ENCOUNTER — Other Ambulatory Visit: Payer: Managed Care, Other (non HMO)

## 2013-11-14 LAB — COMPREHENSIVE METABOLIC PANEL
ALT: 26 U/L (ref 0–35)
AST: 22 U/L (ref 0–37)
Albumin: 3.9 g/dL (ref 3.5–5.2)
Alkaline Phosphatase: 75 U/L (ref 39–117)
BUN: 11 mg/dL (ref 6–23)
CO2: 29 mEq/L (ref 19–32)
Calcium: 9.9 mg/dL (ref 8.4–10.5)
Chloride: 100 mEq/L (ref 96–112)
Creatinine, Ser: 0.8 mg/dL (ref 0.4–1.2)
GFR: 94.81 mL/min (ref >60.00)
Glucose, Bld: 84 mg/dL (ref 70–99)
Potassium: 4.4 mEq/L (ref 3.5–5.1)
Sodium: 134 mEq/L — ABNORMAL LOW (ref 135–145)
Total Bilirubin: 0.7 mg/dL (ref 0.2–1.2)
Total Protein: 6.8 g/dL (ref 6.0–8.3)

## 2013-11-14 LAB — CBC WITH DIFFERENTIAL/PLATELET
BASOS ABS: 0.1 10*3/uL (ref 0.0–0.1)
Basophils Relative: 1.1 % (ref 0.0–3.0)
EOS ABS: 0.1 10*3/uL (ref 0.0–0.7)
Eosinophils Relative: 0.7 % (ref 0.0–5.0)
HEMATOCRIT: 37.5 % (ref 36.0–46.0)
HEMOGLOBIN: 12.3 g/dL (ref 12.0–15.0)
LYMPHS ABS: 2.9 10*3/uL (ref 0.7–4.0)
Lymphocytes Relative: 32.7 % (ref 12.0–46.0)
MCHC: 32.8 g/dL (ref 30.0–36.0)
MCV: 93.1 fl (ref 78.0–100.0)
MONOS PCT: 4.7 % (ref 3.0–12.0)
Monocytes Absolute: 0.4 10*3/uL (ref 0.1–1.0)
NEUTROS ABS: 5.3 10*3/uL (ref 1.4–7.7)
Neutrophils Relative %: 60.8 % (ref 43.0–77.0)
Platelets: 426 10*3/uL — ABNORMAL HIGH (ref 150.0–400.0)
RBC: 4.03 Mil/uL (ref 3.87–5.11)
RDW: 13.6 % (ref 11.5–15.5)
WBC: 8.7 10*3/uL (ref 4.0–10.5)

## 2013-11-14 LAB — LIPID PANEL
CHOL/HDL RATIO: 4
CHOLESTEROL: 169 mg/dL (ref 0–200)
HDL: 38 mg/dL — AB (ref >39.00)
LDL CALC: 115 mg/dL — AB (ref 0–99)
NonHDL: 131
TRIGLYCERIDES: 78 mg/dL (ref 0.0–149.0)
VLDL: 15.6 mg/dL (ref 0.0–40.0)

## 2013-11-14 LAB — HEMOGLOBIN A1C: HEMOGLOBIN A1C: 5.6 % (ref 4.6–6.5)

## 2013-11-14 LAB — VITAMIN D 25 HYDROXY (VIT D DEFICIENCY, FRACTURES): VITD: 28 ng/mL — ABNORMAL LOW (ref 30.00–100.00)

## 2013-11-16 ENCOUNTER — Telehealth: Payer: Self-pay | Admitting: Nurse Practitioner

## 2013-11-16 NOTE — Telephone Encounter (Signed)
pls call pt: Advise  Vit D too low. Start 5000 iu daily D3. Take with meal. Schedule OV in 3 mos to recheck level.  Good cholesterol is too low. It will increase w/exercise. Goal is 30 minutes 5 days/week.  Platelets are slightly elevated. This is likely due to chronic inflammation related to weight. Weight reduction is recommended. She should implement strategies we discussed -cut out refined sugars, carbohydrates should have 4 gm fiber per serving. Schedule OV in 4 weeks to evaluate changes & results. Start taking 81 mg aspirin daily until platelets normalize.

## 2013-11-17 NOTE — Telephone Encounter (Signed)
LMOVM for pt to return call concerning results. 

## 2013-11-17 NOTE — Telephone Encounter (Signed)
Patient returned call and was given lab results. Patient scheduled 4 week ov appt.

## 2013-12-15 ENCOUNTER — Encounter: Payer: Self-pay | Admitting: Nurse Practitioner

## 2013-12-15 ENCOUNTER — Ambulatory Visit (INDEPENDENT_AMBULATORY_CARE_PROVIDER_SITE_OTHER): Payer: Managed Care, Other (non HMO) | Admitting: Nurse Practitioner

## 2013-12-15 VITALS — BP 122/74 | HR 88 | Temp 98.2°F | Resp 18 | Ht 67.0 in | Wt 277.0 lb

## 2013-12-15 DIAGNOSIS — F32A Depression, unspecified: Secondary | ICD-10-CM

## 2013-12-15 DIAGNOSIS — F329 Major depressive disorder, single episode, unspecified: Secondary | ICD-10-CM

## 2013-12-15 DIAGNOSIS — Z6841 Body Mass Index (BMI) 40.0 and over, adult: Secondary | ICD-10-CM

## 2013-12-15 DIAGNOSIS — F3289 Other specified depressive episodes: Secondary | ICD-10-CM

## 2013-12-15 DIAGNOSIS — J209 Acute bronchitis, unspecified: Secondary | ICD-10-CM

## 2013-12-15 MED ORDER — BENZONATATE 100 MG PO CAPS: ORAL_CAPSULE | ORAL | Status: AC

## 2013-12-15 MED ORDER — HYDROCODONE-HOMATROPINE 5-1.5 MG/5ML PO SYRP: 5.0000 mL | ORAL_SOLUTION | Freq: Every evening | ORAL | Status: AC | PRN

## 2013-12-15 NOTE — Progress Notes (Signed)
Subjective:     Anita Gray is a 28 y.o. female who presents for f/u of weight loss management, depression, & vitamin D deficiency. She c/o cough for 1 week. Weight loss: she lost 9 lb. In 4 weeks by cutting out sugar, refined flours & increased walking. She is motivated to continue. We discussed diet plans for next 8 weeks.  She is taking vitamin D3 daily. She continues with zoloft. She has ot seen a therapist as recommended, but states she & husband have made some progress with communication. Her job continues to be stressful. She is not tearful, smiling, looks much happier than at last OV. She has been coughing for 1 week. Kids are sick. No nasal congestion, HA or fever. Chest hurts w/cough at sternum. Dry cough. Taking mucinex D w/no relief.    The following portions of the patient's history were reviewed and updated as appropriate: allergies, current medications, past medical history, past social history, past surgical history and problem list.  Review of Systems Pertinent items are noted in HPI.    Objective:    BP 122/74  Pulse 88  Temp(Src) 98.2 F (36.8 C) (Oral)  Resp 18  Ht  (1.702 m)  Wt 277 lb (125.646 kg)  BMI 43.37 kg/m2  SpO2 94% BP 122/74  Pulse 88  Temp(Src) 98.2 F (36.8 C) (Oral)  Resp 18  Ht  (1.702 m)  Wt 277 lb (125.646 kg)  BMI 43.37 kg/m2  SpO2 94% General appearance: alert, cooperative, appears stated age, mild distress and frequent coughing Head: Normocephalic, without obvious abnormality, atraumatic Eyes: negative findings: lids and lashes normal and conjunctivae and sclerae normal Ears: bilat canals nml. R TM has cloudy effusion, bones visible. L TM has scar, bones visible. Throat: lips, mucosa, and tongue normal; teeth and gums normal Lungs: clear to auscultation bilaterally Heart: regular rate and rhythm, S1, S2 normal, no murmur, click, rub or gallop    Assessment:     1. Acute bronchitis, unspecified organism - benzonatate  (TESSALON) 100 MG capsule; Take 1-2 capsules po up to 3 times daily PRN cough  Dispense: 60 capsule; Refill: 0 - HYDROcodone-homatropine (HYCODAN) 5-1.5 MG/5ML syrup; Take 5 mLs by mouth at bedtime as needed for cough.  Dispense: 120 mL; Refill: 0  2. BMI 40.0-44.9, adult  3. Depressed  See problem list for complete A&P See pt instructions. F/u 8 weeks.

## 2013-12-15 NOTE — Assessment & Plan Note (Signed)
9 lb wt loss in 4 weeks w/cutting out sugar & refined carbs. & walking. Step up: more fruits & vegetables & beans, less meats. Diet plan given for 8 weeks. Motivated to lose. Education given regarding decreasing risk for chronic disease as weight comes off. F/u 8 weeks.

## 2013-12-15 NOTE — Patient Instructions (Signed)
This is a viral illness. It may last 3-4 weeks. Take benzonatate capsules to decrease dry cough. Use cough syrup at night-take 8 hours before you want to wake. Rest. Let us know if you develop fever or chest pain with inspiration.  For 8 weeks, make these changes to decrease your risk for chronic disease:   Breakfast: fruit, berries, melons and/or 1 cup whole grain cereal   Lunch: a plate of green food to include kale, spinach, leaf or Boston lettuce, cabbage, cucumbers, carrots, tomatoes, onions, peppers, avacado, radishes, broccoli, cauliflower, olives. More fruit.  Dinner: steamed vegetables to include peppers, broccoli, greens, spinach, bok choy, zucchini, squash, onions, garlic. More fruit.   You may have 1 cup beans daily; 1 cup cereal or whole grain (has to have 4 grams fiber/serving) or 1 cup potatoes; and a handful of nuts/seeds daily.  You could have 1/2 cup cereal in morning and 1/2 cup brown rice or potatoes at dinner/lunch.   Limit meat & eggs to 3 times/week.   No sweet drinks or foods.   Weak moments are OK. Just get back to it the next meal. Consider reading Eat to Live by Milana Huntsman few pages/day.  Continue Vitamin D3. See you in 8 weeks.   Bronchitis Bronchitis is inflammation of the airways that extend from the windpipe into the lungs (bronchi). The inflammation often causes mucus to develop, which leads to a cough. If the inflammation becomes severe, it may cause shortness of breath. CAUSES  Bronchitis may be caused by:   Viral infections.   Bacteria.   Cigarette smoke.   Allergens, pollutants, and other irritants.  SIGNS AND SYMPTOMS  The most common symptom of bronchitis is a frequent cough that produces mucus. Other symptoms include:  Fever.   Body aches.   Chest congestion.   Chills.   Shortness of breath.   Sore throat.  DIAGNOSIS  Bronchitis is usually diagnosed through a medical history and physical exam. Tests, such as chest  X-rays, are sometimes done to rule out other conditions.  TREATMENT  You may need to avoid contact with whatever caused the problem (smoking, for example). Medicines are sometimes needed. These may include:  Antibiotics. These may be prescribed if the condition is caused by bacteria.  Cough suppressants. These may be prescribed for relief of cough symptoms.   Inhaled medicines. These may be prescribed to help open your airways and make it easier for you to breathe.   Steroid medicines. These may be prescribed for those with recurrent (chronic) bronchitis. HOME CARE INSTRUCTIONS  Get plenty of rest.   Drink enough fluids to keep your urine clear or pale yellow (unless you have a medical condition that requires fluid restriction). Increasing fluids may help thin your secretions and will prevent dehydration.   Only take over-the-counter or prescription medicines as directed by your health care provider.  Only take antibiotics as directed. Make sure you finish them even if you start to feel better.  Avoid secondhand smoke, irritating chemicals, and strong fumes. These will make bronchitis worse. If you are a smoker, quit smoking. Consider using nicotine gum or skin patches to help control withdrawal symptoms. Quitting smoking will help your lungs heal faster.   Put a cool-mist humidifier in your bedroom at night to moisten the air. This may help loosen mucus. Change the water in the humidifier daily. You can also run the hot water in your shower and sit in the bathroom with the door closed for 5 10 minutes.  Follow up with your health care provider as directed.   Wash your hands frequently to avoid catching bronchitis again or spreading an infection to others.  SEEK MEDICAL CARE IF: Your symptoms do not improve after 1 week of treatment.  SEEK IMMEDIATE MEDICAL CARE IF:  Your fever increases.  You have chills.   You have chest pain.   You have worsening shortness of  breath.   You have bloody sputum.  You faint.  You have lightheadedness.  You have a severe headache.   You vomit repeatedly. MAKE SURE YOU:   Understand these instructions.  Will watch your condition.  Will get help right away if you are not doing well or get worse. Document Released: 03/20/2005 Document Revised: 01/08/2013 Document Reviewed: 11/12/2012 [facility] Patient Information 2014 [place], [place].

## 2013-12-15 NOTE — Assessment & Plan Note (Signed)
Feeling better w/home life. She & husband are communicating. Job still stressful. Probably will not make appt. For therapy. Continue current meds.

## 2013-12-15 NOTE — Progress Notes (Signed)
Pre visit review using our clinic review tool, if applicable. No additional management support is needed unless otherwise documented below in the visit note. 

## 2014-02-13 ENCOUNTER — Ambulatory Visit: Payer: Managed Care, Other (non HMO) | Admitting: Nurse Practitioner

## 2016-04-24 IMAGING — US US SOFT TISSUE HEAD/NECK
1 series · 14 of 25 positions shown · non-contrast
Comparison: None.

CLINICAL DATA: Thyroid fullness

EXAM:
THYROID ULTRASOUND
TECHNIQUE: Ultrasound examination of the thyroid gland and adjacent soft
tissues was performed.

[Series 1: us soft tissue head/neck · 0.07mm/px · 14 of 42 slices shown]
[im 1/42]
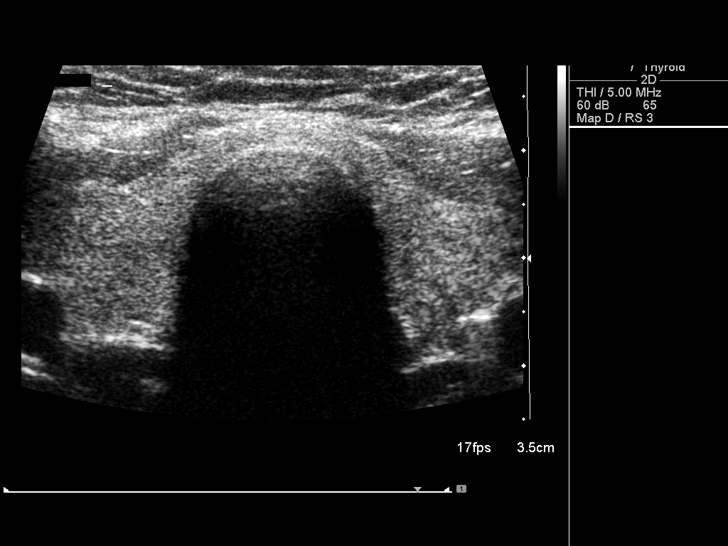
[im 4/42]
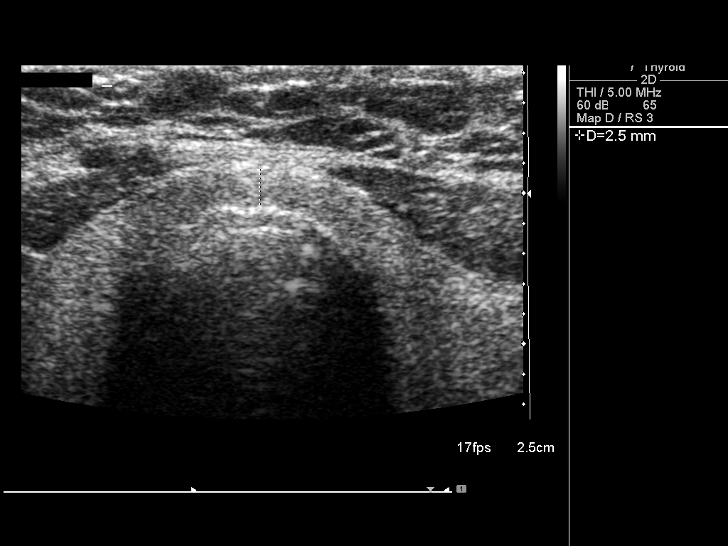
[im 7/42]
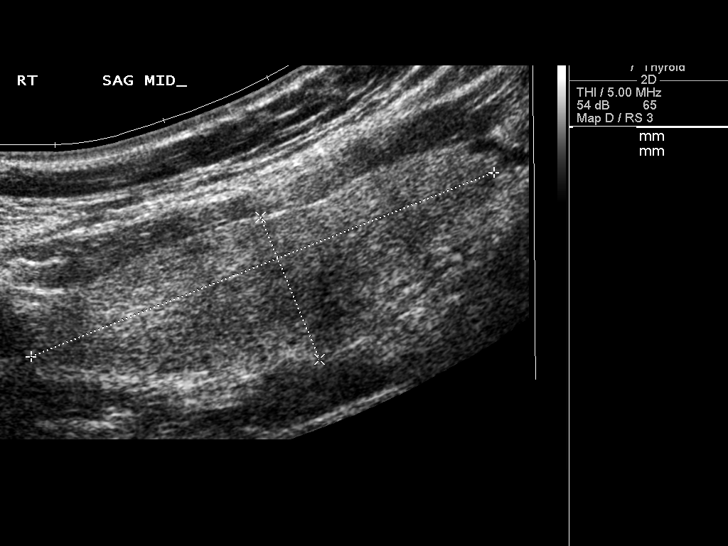
[im 11/42]
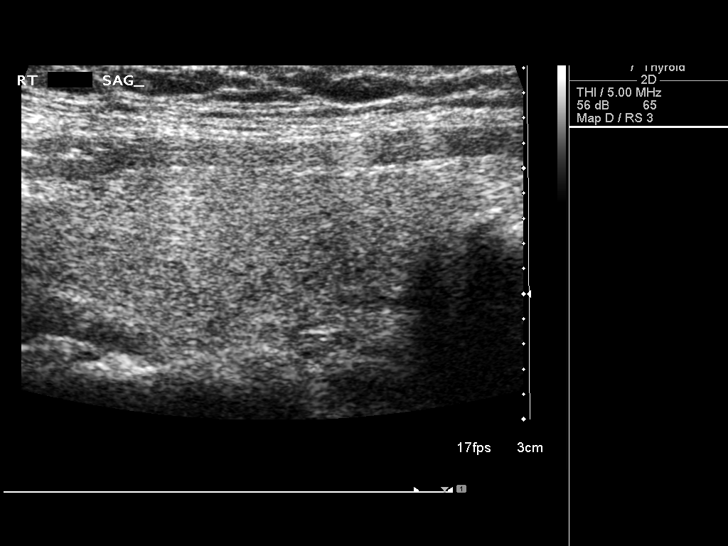
[im 14/42]
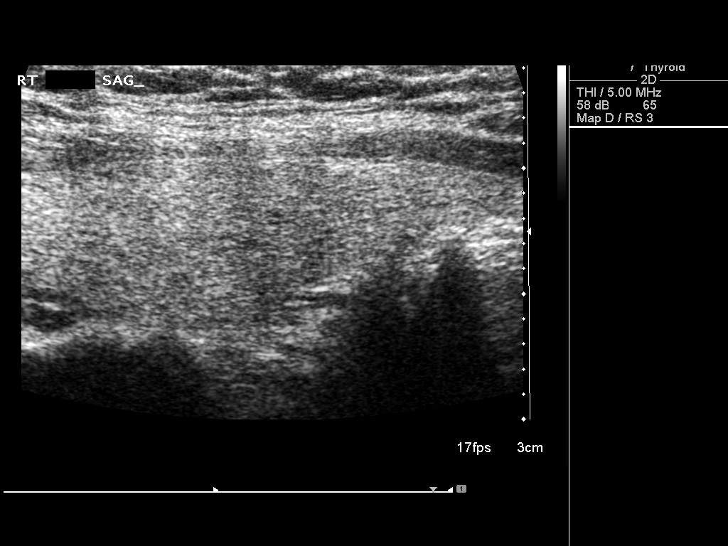
[im 16/42]
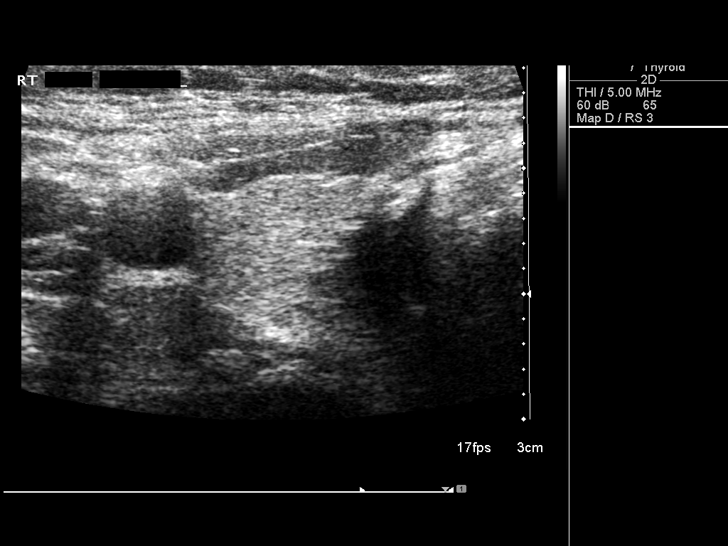
[im 19/42]
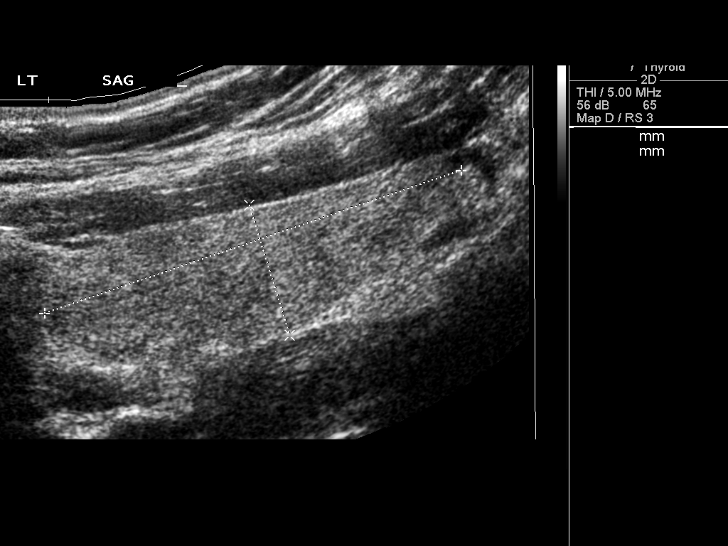
[im 23/42]
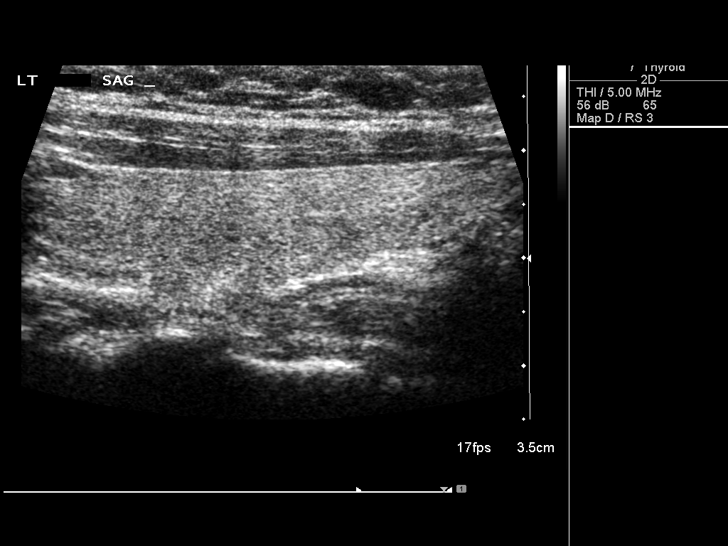
[im 26/42]
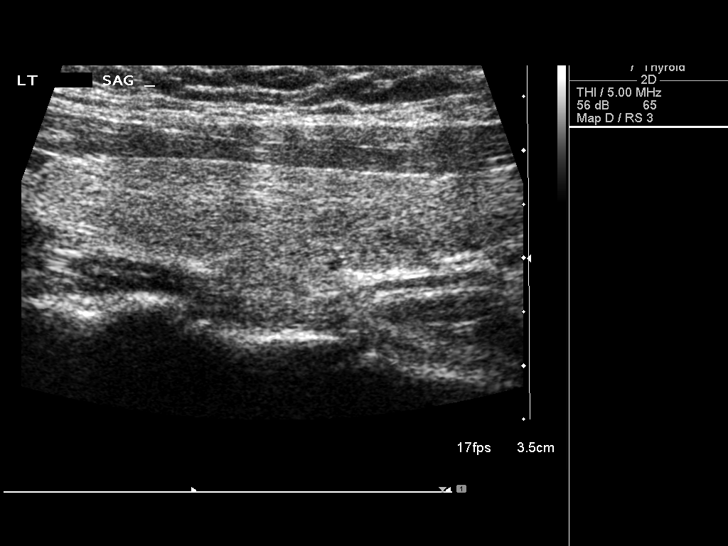
[im 28/42]
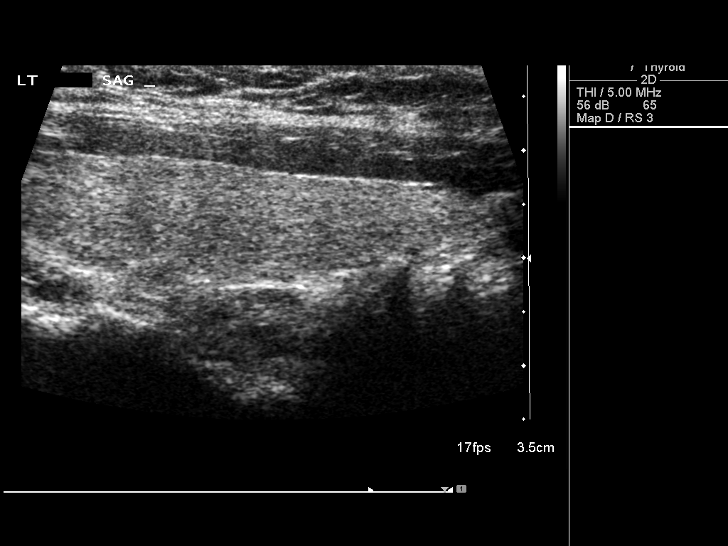
[im 31/42]
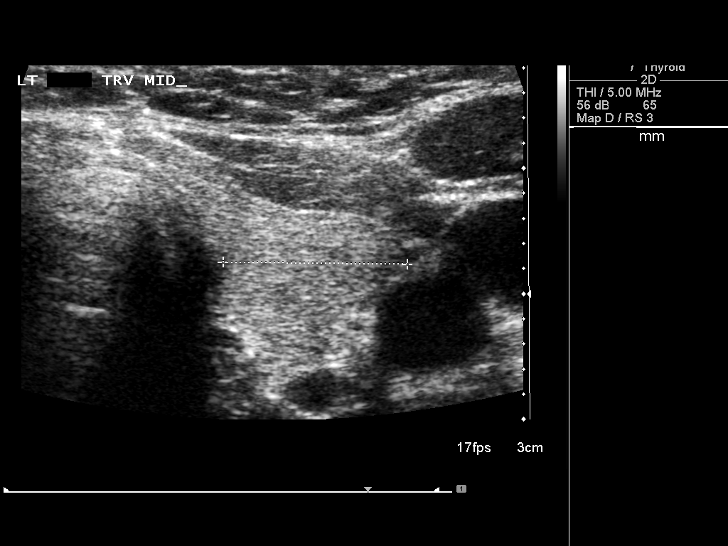
[im 35/42]
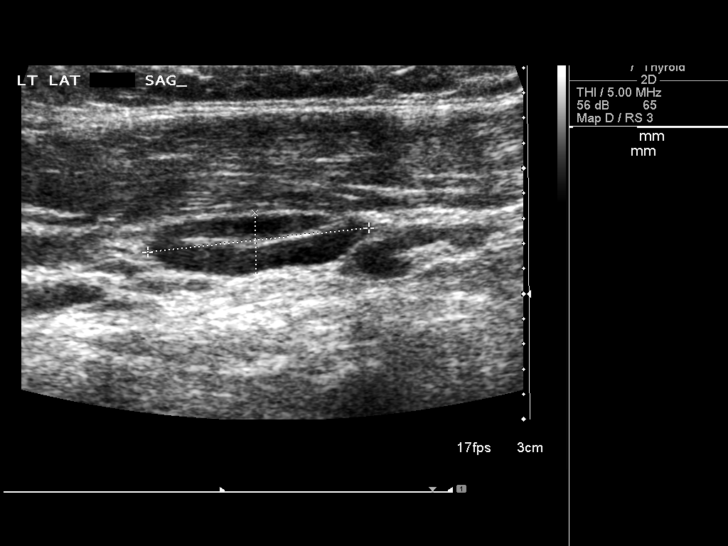
[im 38/42]
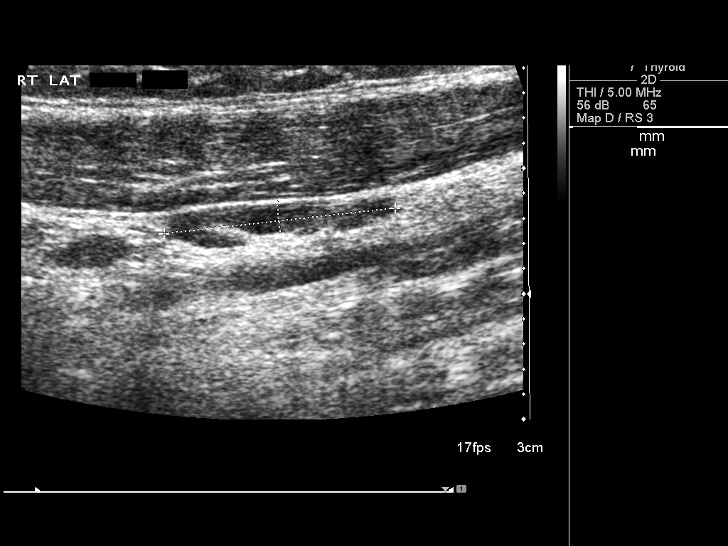
[im 42/42]
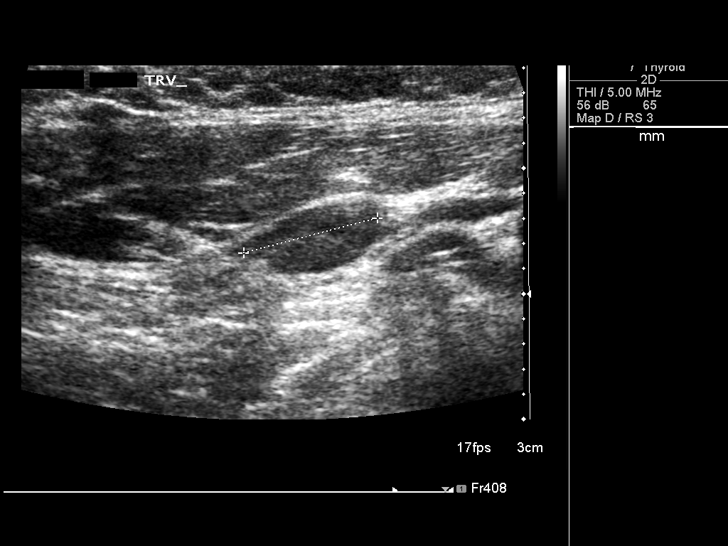

[14 of 25 positions shown; findings below may reference images not displayed]

FINDINGS: Right thyroid lobe

Measurements: 4.4 x 1.4 x 1.8 cm.  No nodules visualized.

Left thyroid lobe

Measurements: 3.9 x 1.2 x 1.5 cm.  No nodules visualized.

Isthmus

Thickness: 0.3 cm.  No nodules visualized.

Lymphadenopathy

Bilateral cervical chain lymph nodes demonstrate normal morphology
and fatty hila. These are likely within normal limits for age.
IMPRESSION: Normal thyroid ultrasound.
